# Patient Record
Sex: Female | Born: 1960 | Race: Asian | Hispanic: No | Marital: Married | Smoking: Never smoker
Health system: Southern US, Community
[De-identification: ages and names within clinical notes are randomized; demographics above are authoritative.]

## PROBLEM LIST (undated history)

## (undated) HISTORY — PX: OTHER SURGICAL HISTORY: SHX169

## (undated) HISTORY — PX: UTERINE FIBROID SURGERY: SHX826

---

## 2005-08-08 ENCOUNTER — Ambulatory Visit: Payer: Self-pay | Admitting: General Practice

## 2006-08-30 ENCOUNTER — Ambulatory Visit: Payer: Self-pay | Admitting: Obstetrics and Gynecology

## 2010-07-24 ENCOUNTER — Emergency Department: Payer: Self-pay | Admitting: Unknown Physician Specialty

## 2010-07-27 ENCOUNTER — Emergency Department: Payer: Self-pay | Admitting: Emergency Medicine

## 2011-04-12 ENCOUNTER — Ambulatory Visit: Payer: Self-pay | Admitting: Family Medicine

## 2011-06-07 ENCOUNTER — Ambulatory Visit: Payer: Self-pay | Admitting: Gastroenterology

## 2012-10-15 ENCOUNTER — Ambulatory Visit: Payer: Self-pay | Admitting: Family Medicine

## 2012-11-15 ENCOUNTER — Ambulatory Visit: Payer: Self-pay | Admitting: Orthopedic Surgery

## 2013-07-03 ENCOUNTER — Ambulatory Visit: Payer: Self-pay | Admitting: Internal Medicine

## 2013-10-16 ENCOUNTER — Ambulatory Visit: Payer: Self-pay | Admitting: Internal Medicine

## 2013-10-23 ENCOUNTER — Ambulatory Visit: Payer: Self-pay | Admitting: Internal Medicine

## 2014-11-17 ENCOUNTER — Other Ambulatory Visit: Payer: Self-pay | Admitting: Internal Medicine

## 2014-11-17 DIAGNOSIS — Z1239 Encounter for other screening for malignant neoplasm of breast: Secondary | ICD-10-CM

## 2014-11-26 ENCOUNTER — Ambulatory Visit: Payer: Self-pay | Attending: Internal Medicine

## 2014-12-10 ENCOUNTER — Ambulatory Visit
Admission: RE | Admit: 2014-12-10 | Discharge: 2014-12-10 | Disposition: A | Discharge status: Home / Self Care | Payer: Managed Care, Other (non HMO) | Source: Ambulatory Visit | Attending: Internal Medicine | Admitting: Internal Medicine

## 2014-12-10 DIAGNOSIS — Z1231 Encounter for screening mammogram for malignant neoplasm of breast: Secondary | ICD-10-CM | POA: Insufficient documentation

## 2014-12-10 DIAGNOSIS — Z1239 Encounter for other screening for malignant neoplasm of breast: Secondary | ICD-10-CM

## 2015-07-15 ENCOUNTER — Other Ambulatory Visit: Payer: Self-pay | Admitting: Internal Medicine

## 2015-07-15 DIAGNOSIS — Z1239 Encounter for other screening for malignant neoplasm of breast: Secondary | ICD-10-CM

## 2015-12-13 ENCOUNTER — Ambulatory Visit: Payer: Managed Care, Other (non HMO) | Attending: Internal Medicine

## 2016-07-17 DIAGNOSIS — Z780 Asymptomatic menopausal state: Secondary | ICD-10-CM | POA: Insufficient documentation

## 2016-07-17 DIAGNOSIS — Z8781 Personal history of (healed) traumatic fracture: Secondary | ICD-10-CM | POA: Insufficient documentation

## 2016-08-12 ENCOUNTER — Encounter: Payer: Self-pay | Admitting: Emergency Medicine

## 2016-08-12 ENCOUNTER — Emergency Department
Admission: EM | Admit: 2016-08-12 | Discharge: 2016-08-12 | Disposition: A | Discharge status: Home / Self Care | Payer: Managed Care, Other (non HMO) | Attending: Emergency Medicine | Admitting: Emergency Medicine

## 2016-08-12 DIAGNOSIS — H578 Other specified disorders of eye and adnexa: Secondary | ICD-10-CM | POA: Diagnosis present

## 2016-08-12 DIAGNOSIS — H1031 Unspecified acute conjunctivitis, right eye: Secondary | ICD-10-CM | POA: Insufficient documentation

## 2016-08-12 DIAGNOSIS — H1131 Conjunctival hemorrhage, right eye: Secondary | ICD-10-CM | POA: Insufficient documentation

## 2016-08-12 MED ORDER — POLYMYXIN B-TRIMETHOPRIM 10000-0.1 UNIT/ML-% OP SOLN: 2.0000 [drp] | Freq: Two times a day (BID) | OPHTHALMIC | 0 refills | Status: DC

## 2016-08-12 NOTE — ED Provider Notes (Signed)
[facility] Emergency Department Provider Note  ____________________________________________  Time seen: Approximately 6:53 PM  I have reviewed the triage vital signs and the nursing notes.   HISTORY  Chief Complaint Eye Problem    HPI Janice Robinson is a 56 y.o. female who presents emergency department complaining of right eye redness, irritation, drainage. Patient reports that a few days ago at work, she was sweating and all the somewhat with makeup ran into her right eye. Patient reports that since then she has had irritation, drainage,  Redness to the eye.no visual changes. Patient does not wear glasses or contacts. No involvement of the left eye. No fevers or chills, headaches, nasal congestion, sore throat, neck pain, chest pain, shortness of breath, abdominal pain, no vomiting. No medications prior to arrival.   History reviewed. No pertinent past medical history.  There are no active problems to display for this patient.   History reviewed. No pertinent surgical history.  Prior to Admission medications   Medication Sig Start Date End Date Taking? Authorizing Provider  trimethoprim-polymyxin b (POLYTRIM) ophthalmic solution Place 2 drops into the right eye 2 (two) times daily. 08/12/16   Doretta Remmert, Delorise Royals, PA-C    Allergies Patient has no known allergies.  No family history on file.  Social History Social History  Substance Use Topics  . Smoking status: Never Smoker  . Smokeless tobacco: Not on file  . Alcohol use Not on file     Review of Systems  Constitutional: No fever/chills Eyes: No visual changes. Positive for redness and drainage to the right eye. ENT: No upper respiratory complaints. Cardiovascular: no chest pain. Respiratory: no cough. No SOB. Gastrointestinal: No abdominal pain.  No nausea, no vomiting.  Musculoskeletal: Negative for musculoskeletal pain. Skin: Negative for rash, abrasions, lacerations,  ecchymosis. Neurological: Negative for headaches, focal weakness or numbness. 10-point ROS otherwise negative.  ____________________________________________   PHYSICAL EXAM:  VITAL SIGNS: ED Triage Vitals  Enc Vitals Group     BP 08/12/16 1819 110/65     Pulse Rate 08/12/16 1819 67     Resp 08/12/16 1819 18     Temp 08/12/16 1819 98 F (36.7 C)     Temp Source 08/12/16 1819 Oral     SpO2 08/12/16 1819 99 %     Weight 08/12/16 1820 120 lb (54.4 kg)     Height 08/12/16 1820 5\' 4"  (1.626 m)     Head Circumference --      Peak Flow --      Pain Score --      Pain Loc --      Pain Edu? --      Excl. in GC? --      Constitutional: Alert and oriented. Well appearing and in no acute distress. Eyes: Conconjunctiva on right is erythematous. There is subconjunctival hemorrhaging in the 6:00 position right eye. No drainage is identified at this time. Funduscopic exam reveals good red reflex, vasculature, optic disc with no acute abnormality.Marland Kitchen PERRL. EOMI. Head: Atraumatic. ENT:      Ears:       Nose: No congestion/rhinnorhea.      Mouth/Throat: Mucous membranes are moist.  Neck: No stridor.    Cardiovascular: Normal rate, regular rhythm. Normal S1 and S2.  Good peripheral circulation. Respiratory: Normal respiratory effort without tachypnea or retractions. Lungs CTAB. Good air entry to the bases with no decreased or absent breath sounds. Musculoskeletal: Full range of motion to all extremities. No gross deformities appreciated. Neurologic:  Normal speech and language. No gross focal neurologic deficits are appreciated.  Skin:  Skin is warm, dry and intact. No rash noted. Psychiatric: Mood and affect are normal. Speech and behavior are normal. Patient exhibits appropriate insight and judgement.   ____________________________________________   LABS (all labs ordered are listed, but only abnormal results are displayed)  Labs Reviewed - No data to  display ____________________________________________  EKG   ____________________________________________  RADIOLOGY   No results found.  ____________________________________________    PROCEDURES  Procedure(s) performed:    Procedures    Medications - No data to display   ____________________________________________   INITIAL IMPRESSION / ASSESSMENT AND PLAN / ED COURSE  Pertinent labs & imaging results that were available during my care of the patient were reviewed by me and considered in my medical decision making (see chart for details).  Review of the [place] CSRS was performed in accordance of the [facility] prior to dispensing any controlled drugs.     Patient's diagnosis is consistent with conjunctivitis and subconjunctival hemorrhage to the right eye. Exam was reassuring no indication for further workup.. Patient will be discharged home with prescriptions for antibiotic eyedrops. Patient is to follow up with primary care or ophthalmology as needed or otherwise directed. Patient is given ED precautions to return to the ED for any worsening or new symptoms.     ____________________________________________  FINAL CLINICAL IMPRESSION(S) / ED DIAGNOSES  Final diagnoses:  Acute bacterial conjunctivitis of right eye  Subconjunctival hemorrhage of right eye      NEW MEDICATIONS STARTED DURING THIS VISIT:  New Prescriptions   TRIMETHOPRIM-POLYMYXIN B (POLYTRIM) OPHTHALMIC SOLUTION    Place 2 drops into the right eye 2 (two) times daily.        This chart was dictated using voice recognition software/Dragon. Despite best efforts to proofread, errors can occur which can change the meaning. Any change was purely unintentional.    Racheal PatchesCuthriell, Yasamin Karel D, PA-C 08/12/16 1914    Phineas SemenGoodman, Graydon, MD 08/12/16 [phone]

## 2016-08-12 NOTE — ED Triage Notes (Signed)
R eye redness since yesterday.

## 2016-08-12 NOTE — ED Notes (Signed)

## 2017-01-08 ENCOUNTER — Emergency Department
Admission: EM | Admit: 2017-01-08 | Discharge: 2017-01-08 | Disposition: A | Discharge status: Home / Self Care | Payer: Managed Care, Other (non HMO) | Attending: Emergency Medicine | Admitting: Emergency Medicine

## 2017-01-08 ENCOUNTER — Emergency Department: Payer: Managed Care, Other (non HMO)

## 2017-01-08 ENCOUNTER — Encounter: Payer: Self-pay | Admitting: Intensive Care

## 2017-01-08 DIAGNOSIS — Y92410 Unspecified street and highway as the place of occurrence of the external cause: Secondary | ICD-10-CM | POA: Diagnosis not present

## 2017-01-08 DIAGNOSIS — S20211A Contusion of right front wall of thorax, initial encounter: Secondary | ICD-10-CM | POA: Insufficient documentation

## 2017-01-08 DIAGNOSIS — S161XXA Strain of muscle, fascia and tendon at neck level, initial encounter: Secondary | ICD-10-CM | POA: Diagnosis not present

## 2017-01-08 DIAGNOSIS — Y939 Activity, unspecified: Secondary | ICD-10-CM | POA: Diagnosis not present

## 2017-01-08 DIAGNOSIS — Y999 Unspecified external cause status: Secondary | ICD-10-CM | POA: Diagnosis not present

## 2017-01-08 DIAGNOSIS — S199XXA Unspecified injury of neck, initial encounter: Secondary | ICD-10-CM | POA: Diagnosis present

## 2017-01-08 MED ORDER — METHOCARBAMOL 750 MG PO TABS: 750.0000 mg | ORAL_TABLET | Freq: Four times a day (QID) | ORAL | 0 refills | Status: DC

## 2017-01-08 NOTE — Discharge Instructions (Signed)
Follow-up with your doctor. Not better in 5-7 days, use of Robaxin for muscle aches and pains, use over-the-counter ibuprofen or Tylenol to help with pain and inflammation, use ice to any areas that hurt, return if you're worsening

## 2017-01-08 NOTE — ED Notes (Signed)
See provider note for assessment
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
## 2017-01-15 ENCOUNTER — Other Ambulatory Visit: Payer: Self-pay | Admitting: Internal Medicine

## 2017-01-15 DIAGNOSIS — Z1239 Encounter for other screening for malignant neoplasm of breast: Secondary | ICD-10-CM

## 2017-04-26 ENCOUNTER — Ambulatory Visit
Admission: RE | Admit: 2017-04-26 | Discharge: 2017-04-26 | Disposition: A | Discharge status: Home / Self Care | Payer: Managed Care, Other (non HMO) | Source: Ambulatory Visit | Attending: Internal Medicine | Admitting: Internal Medicine

## 2017-04-26 DIAGNOSIS — Z1239 Encounter for other screening for malignant neoplasm of breast: Secondary | ICD-10-CM

## 2017-04-26 DIAGNOSIS — Z1231 Encounter for screening mammogram for malignant neoplasm of breast: Secondary | ICD-10-CM | POA: Insufficient documentation

## 2017-07-19 ENCOUNTER — Other Ambulatory Visit: Payer: Self-pay | Admitting: Internal Medicine

## 2017-07-19 DIAGNOSIS — R3129 Other microscopic hematuria: Secondary | ICD-10-CM

## 2017-08-14 NOTE — Progress Notes (Signed)
08/15/2017 9:36 AM   Crislyn T Quant May 10, 1960 [id number]  Referring provider: Leotis Shames, MD [address] [facility] [place], [place] [address]  Chief Complaint  Patient presents with  . Hematuria    HPI: Patient is a 57 -year-old [place] female who presents today as a referral from Dr. Leotis Shames for microscopic hematuria.    Patient was found to have microscopic hematuria on 07/17/2016 with 4-10 RBC's/hpf, on 11/06/2016 with 0-3 RBC's/hpf and on 07/17/2017 with 4-10 RBC's/hpf.  All with negative cultures.    She does not have a prior history of recurrent urinary tract infections, nephrolithiasis, trauma to the genitourinary tract or malignancies of the genitourinary tract.   She does not have a family medical history of nephrolithiasis, malignancies of the genitourinary tract or hematuria.   Today, she is not having symptoms of frequent urination, urgency, dysuria, nocturia, incontinence, hesitancy, intermittency, straining to urinate or a weak urinary stream.  Patient denies any gross hematuria, dysuria or suprapubic/flank pain.  Patient denies any fevers, chills, nausea or vomiting.  Her UA today demonstrates 3-10 RBC's.    She is not a smoker.  She is not exposed to second hand smoke.  She worked with Personnel officer, trichloroethylene, etc.   She has been in the [place] for the last 30 years.  She has not visited her home country since that time.    PMH: History reviewed. No pertinent past medical history.  Surgical History: Past Surgical History:  Procedure Laterality Date  . UTERINE FIBROID SURGERY      Home Medications:  Allergies as of 08/15/2017   No Known Allergies     Medication List        Accurate as of 08/15/17  9:36 AM. Always use your most recent med list.          ONE-A-DAY WOMENS PRENATAL 1 28-0.8-235 MG Caps Take by mouth.       Allergies: No Known Allergies  Family History: Family History  Problem Relation  Age of Onset  . Breast cancer Neg Hx   . Bladder Cancer Neg Hx   . Kidney cancer Neg Hx     Social History:  reports that she has never smoked. She does not have any smokeless tobacco history on file. She reports that she does not drink alcohol. Her drug history is not on file.  ROS: UROLOGY Frequent Urination?: No Hard to postpone urination?: No Burning/pain with urination?: No Get up at night to urinate?: No Leakage of urine?: No Urine stream starts and stops?: No Trouble starting stream?: No Do you have to strain to urinate?: No Blood in urine?: No Urinary tract infection?: No Sexually transmitted disease?: No Injury to kidneys or bladder?: No Painful intercourse?: No Weak stream?: No Currently pregnant?: No Vaginal bleeding?: No Last menstrual period?: n  Gastrointestinal Nausea?: No Vomiting?: No Indigestion/heartburn?: No Diarrhea?: No Constipation?: No  Constitutional Fever: No Night sweats?: No Weight loss?: No Fatigue?: No  Skin Skin rash/lesions?: No Itching?: No  Eyes Blurred vision?: No Double vision?: No  Ears/Nose/Throat Sore throat?: No Sinus problems?: No  Hematologic/Lymphatic Swollen glands?: No Easy bruising?: No  Cardiovascular Leg swelling?: No Chest pain?: No  Respiratory Cough?: No Shortness of breath?: No  Endocrine Excessive thirst?: No  Musculoskeletal Back pain?: No Joint pain?: No  Neurological Headaches?: No Dizziness?: No  Psychologic Depression?: No Anxiety?: No  Physical Exam: BP 120/67   Pulse (!) 59   Wt 117 lb 11.2 oz (53.4  kg)   BMI 22.99 kg/m   Constitutional:  Well nourished. Alert and oriented, No acute distress. HEENT: [place] AT, moist mucus membranes.  Trachea midline, no masses. Cardiovascular: No clubbing, cyanosis, or edema. Respiratory: Normal respiratory effort, no increased work of breathing. GI: Abdomen is soft, non tender, non distended, no abdominal masses. Liver and spleen not  palpable.  No hernias appreciated.  Stool sample for occult testing is not indicated.   GU: No CVA tenderness.  No bladder fullness or masses.   Skin: No rashes, bruises or suspicious lesions. Lymph: No cervical or inguinal adenopathy. Neurologic: Grossly intact, no focal deficits, moving all 4 extremities. Psychiatric: Normal mood and affect.  Laboratory Data: No results found for: WBC, HGB, HCT, MCV, PLT  No results found for: CREATININE  No results found for: PSA  No results found for: TESTOSTERONE  No results found for: HGBA1C  No results found for: TSH  No results found for: CHOL, HDL, CHOLHDL, VLDL, LDLCALC  No results found for: AST No results found for: ALT No components found for: ALKALINEPHOPHATASE No components found for: BILIRUBINTOTAL  No results found for: ESTRADIOL   Urinalysis No results found for: COLORURINE, APPEARANCEUR, LABSPEC, PHURINE, GLUCOSEU, HGBUR, BILIRUBINUR, KETONESUR, PROTEINUR, UROBILINOGEN, NITRITE, LEUKOCYTESUR  Pertinent Imaging: CT scan in 06/2013 noted prominent gonadal veins  Assessment & Plan:    1. Microscopic hematuria Explained to the patient that there are a number of causes that can be associated with blood in the urine, such as stones, UTI's, damage to the urinary tract and/or cancer. At this time, I felt that the patient warranted further urologic evaluation.   The AUA guidelines state that a CT urogram is the preferred imaging study to evaluate hematuria. I explained to the patient that a contrast material will be injected into a vein and that in rare instances, an allergic reaction can result and may even life threatening   The patient denies any allergies to contrast, iodine and/or seafood and is not taking metformin. Following the imaging study,  I've recommended a cystoscopy. I described how this is performed, typically in an office setting with a flexible cystoscope. We described the risks, benefits, and possible side  effects, the most common of which is a minor amount of blood in the urine and/or burning which usually resolves in 24 to 48 hours.   There is a 20% risk with gross hematuria and a 2% to 5% risk with microscopic hematuria of missing a bladder cancer.  Our goal is to identify the cancer in its early stage so as to give you the greater change of survival and a cure.    The patient had the opportunity to ask questions which were answered. Based upon this discussion, the patient is willing to proceed. Therefore, I've ordered: a CT Urogram and cystoscopy.   - The patient will return following all of the above for discussion of the results.   - UA + 3-10 RBC's.   - Urine culture pending  - BUN + creatinine  pending     Return for CT Urogram report and cystoscopy.  These notes generated with voice recognition software. I apologize for typographical errors.  Michiel CowboySHANNON Chaos Carlile, PA-C  [facility] [address]  [place], [place] [address] [phone]

## 2017-08-15 ENCOUNTER — Encounter: Payer: Self-pay | Admitting: Urology

## 2017-08-15 ENCOUNTER — Ambulatory Visit (INDEPENDENT_AMBULATORY_CARE_PROVIDER_SITE_OTHER): Payer: Managed Care, Other (non HMO) | Admitting: Urology

## 2017-08-15 VITALS — BP 120/67 | HR 59 | Wt 117.7 lb

## 2017-08-15 DIAGNOSIS — R3129 Other microscopic hematuria: Secondary | ICD-10-CM

## 2017-08-15 LAB — URINALYSIS, COMPLETE
Bilirubin, UA: NEGATIVE
Glucose, UA: NEGATIVE
Ketones, UA: NEGATIVE
Leukocytes, UA: NEGATIVE
Nitrite, UA: NEGATIVE
PROTEIN UA: NEGATIVE
Specific Gravity, UA: >1.03 — ABNORMAL HIGH (ref 1.005–1.030)
Urobilinogen, Ur: 0.2 mg/dL (ref 0.2–1.0)
pH, UA: 5 (ref 5.0–7.5)

## 2017-08-15 LAB — MICROSCOPIC EXAMINATION: WBC, UA: NONE SEEN /hpf (ref 0–5)

## 2017-08-16 LAB — BUN+CREAT
BUN/Creatinine Ratio: 18 (ref 9–23)
BUN: 11 mg/dL (ref 6–24)
CREATININE: 0.62 mg/dL (ref 0.57–1.00)
GFR, EST AFRICAN AMERICAN: 116 mL/min/{1.73_m2} (ref >59)
GFR, EST NON AFRICAN AMERICAN: 100 mL/min/{1.73_m2} (ref >59)

## 2017-08-18 LAB — CULTURE, URINE COMPREHENSIVE

## 2017-08-20 ENCOUNTER — Telehealth: Payer: Self-pay | Admitting: Urology

## 2017-08-20 NOTE — Telephone Encounter (Signed)
Dr. Thedore MinsSingh ordered a ct scan for this patient and had gotten it approved. I called her office and spoke with Selena BattenKim her nurse and asked if we could get this scheduled as a urology protocol since it was already approved. They agreed and so I called scheduling and they contacted the patient. We will be canceling the CT scan you ordered and use the one from their office. Patient was advised not to drink any oral contrast.  Marcelino DusterMichelle

## 2017-08-29 ENCOUNTER — Ambulatory Visit
Admission: RE | Admit: 2017-08-29 | Discharge: 2017-08-29 | Disposition: A | Discharge status: Home / Self Care | Payer: Managed Care, Other (non HMO) | Source: Ambulatory Visit | Attending: Internal Medicine | Admitting: Internal Medicine

## 2017-08-29 DIAGNOSIS — N9489 Other specified conditions associated with female genital organs and menstrual cycle: Secondary | ICD-10-CM | POA: Insufficient documentation

## 2017-08-29 DIAGNOSIS — R3129 Other microscopic hematuria: Secondary | ICD-10-CM | POA: Insufficient documentation

## 2017-08-29 DIAGNOSIS — I700 Atherosclerosis of aorta: Secondary | ICD-10-CM | POA: Insufficient documentation

## 2017-08-29 MED ORDER — IOPAMIDOL (ISOVUE-300) INJECTION 61%
100.0000 mL | Freq: Once | INTRAVENOUS | Status: AC | PRN
  Administered 2017-08-29: 100 mL via INTRAVENOUS

## 2017-09-20 ENCOUNTER — Ambulatory Visit (INDEPENDENT_AMBULATORY_CARE_PROVIDER_SITE_OTHER): Payer: Managed Care, Other (non HMO) | Admitting: Urology

## 2017-09-20 ENCOUNTER — Encounter: Payer: Self-pay | Admitting: Urology

## 2017-09-20 VITALS — BP 125/71 | HR 65 | Ht 60.0 in | Wt 117.0 lb

## 2017-09-20 DIAGNOSIS — R3129 Other microscopic hematuria: Secondary | ICD-10-CM | POA: Diagnosis not present

## 2017-09-20 LAB — URINALYSIS, COMPLETE
Bilirubin, UA: NEGATIVE
Glucose, UA: NEGATIVE
Ketones, UA: NEGATIVE
LEUKOCYTES UA: NEGATIVE
Nitrite, UA: NEGATIVE
Protein, UA: NEGATIVE
Specific Gravity, UA: <1.005 — ABNORMAL LOW (ref 1.005–1.030)
Urobilinogen, Ur: 0.2 mg/dL (ref 0.2–1.0)
pH, UA: 6.5 (ref 5.0–7.5)

## 2017-09-20 LAB — MICROSCOPIC EXAMINATION
EPITHELIAL CELLS (NON RENAL): NONE SEEN /HPF (ref 0–10)
WBC UA: NONE SEEN /HPF (ref 0–5)

## 2017-09-20 MED ORDER — LIDOCAINE HCL URETHRAL/MUCOSAL 2 % EX GEL
1.0000 | Freq: Once | CUTANEOUS | Status: AC
Start: 2017-09-20 — End: 2017-09-20
  Administered 2017-09-20: 1 via URETHRAL

## 2017-09-20 MED ORDER — CIPROFLOXACIN HCL 500 MG PO TABS
500.0000 mg | ORAL_TABLET | Freq: Once | ORAL | Status: AC
  Administered 2017-09-20: 500 mg via ORAL

## 2017-09-20 NOTE — Progress Notes (Signed)
   09/20/17  CC:  Chief Complaint  Patient presents with  . Cysto    HPI: Patient returns in evaluation of microscopic hematuria.  She has no dysuria or gross hematuria today.  I reviewed her CT scan which was done August 29, 2017 which was normal.  F/u  Blood pressure 125/71, pulse 65, height 5' (1.524 m), weight 53.1 kg. NED. A&Ox3.   No respiratory distress   Abd soft, NT, ND Normal external genitalia with patent urethral meatus  Cystoscopy Procedure Note  Patient identification was confirmed, informed consent was obtained, and patient was prepped using Betadine solution.  Lidocaine jelly was administered per urethral meatus.    Preoperative abx where received prior to procedure.    Procedure: - Flexible cystoscope introduced, without any difficulty.   - Thorough search of the bladder revealed:    normal urethral meatus    normal urothelium    no stones    no ulcers     no tumors    no urethral polyps    no trabeculation  - Ureteral orifices were normal in position and appearance.- clear efflux  -On exam-the meatus appeared normal, mild vaginal atrophy.  Introitus slightly enlarged, no lesions.  Bladder and urethra palpably normal.  Lyla SonCarrie was chaperone for cystoscopy and exam.  Post-Procedure: - Patient tolerated the procedure well  Assessment/ Plan:  Microscopic hematuria-discussed with patient and her daughter we will see her back in a year unless she develops any worrisome signs or symptoms such as dysuria, flank pain or gross hematuria she should return and/or notify us immediately.  Jerilee FieldMatthew Jermiyah Ricotta, MD

## 2018-04-24 ENCOUNTER — Other Ambulatory Visit: Payer: Self-pay | Admitting: Internal Medicine

## 2018-04-24 DIAGNOSIS — Z1231 Encounter for screening mammogram for malignant neoplasm of breast: Secondary | ICD-10-CM

## 2018-09-24 ENCOUNTER — Ambulatory Visit: Payer: Managed Care, Other (non HMO) | Admitting: Urology

## 2018-10-08 ENCOUNTER — Ambulatory Visit
Admission: RE | Admit: 2018-10-08 | Discharge: 2018-10-08 | Disposition: A | Discharge status: Home / Self Care | Payer: PRIVATE HEALTH INSURANCE | Source: Ambulatory Visit | Attending: Internal Medicine | Admitting: Internal Medicine

## 2018-10-08 DIAGNOSIS — Z1231 Encounter for screening mammogram for malignant neoplasm of breast: Secondary | ICD-10-CM | POA: Insufficient documentation

## 2018-10-14 ENCOUNTER — Ambulatory Visit: Payer: Managed Care, Other (non HMO) | Admitting: Urology

## 2018-10-14 ENCOUNTER — Encounter: Payer: Self-pay | Admitting: Urology

## 2018-10-24 ENCOUNTER — Encounter: Payer: Self-pay | Admitting: Urology

## 2018-10-24 ENCOUNTER — Other Ambulatory Visit: Payer: Self-pay

## 2018-10-24 ENCOUNTER — Ambulatory Visit (INDEPENDENT_AMBULATORY_CARE_PROVIDER_SITE_OTHER): Payer: No Typology Code available for payment source | Admitting: Urology

## 2018-10-24 VITALS — BP 113/65 | HR 60 | Ht <= 58 in | Wt 119.0 lb

## 2018-10-24 DIAGNOSIS — R3129 Other microscopic hematuria: Secondary | ICD-10-CM | POA: Diagnosis not present

## 2018-10-24 NOTE — Progress Notes (Signed)
   10/24/2018 8:59 AM   Janice Robinson Mar 21, 1960 [id number]  Reason for visit: Follow up microscopic hematuria  HPI: I saw Janice Robinson in urology clinic today for follow-up of microscopic hematuria.  She is a healthy 58 year old female that previously presented with 2 episodes of microscopic hematuria with 4-10 RBCs, and underwent a negative for microscopic hematuria work-up in August 2019 with Dr. Junious Silk with CT urogram and cystoscopy.  She is a non-smoker and denies any other carcinogenic exposures.  She denies any gross hematuria, dysuria, fevers, or flank pain since we saw her last.  Urinalysis today is benign with 0-5 WBCs, 0-2 RBCs, no epithelial cells, no bacteria, nitrite negative.    ROS: Please see flowsheet from today's date for complete review of systems.  Physical Exam: BP 113/65   Pulse 60   Ht 4\' 9"  (1.448 m)   Wt 119 lb (54 kg)   BMI 25.75 kg/m     Assessment & Plan:   In summary, the patient is a healthy 58 year old female with a negative microscopic hematuria work-up with CT urogram and cystoscopy in August 2019 with Dr. Junious Silk.  Urinalysis today is benign with 0 RBCs.  We discussed return precautions of gross hematuria, recurrent UTIs, or new urinary symptoms.  Follow-up as needed  A total of 15 minutes were spent face-to-face with the patient, greater than 50% was spent in patient education, counseling, and coordination of care regarding microscopic hematuria.   Billey Co, [facility] [address] [place],  [address] [phone]

## 2018-10-25 LAB — URINALYSIS, COMPLETE
Bilirubin, UA: NEGATIVE
Glucose, UA: NEGATIVE
Ketones, UA: NEGATIVE
Leukocytes,UA: NEGATIVE
Nitrite, UA: NEGATIVE
Protein,UA: NEGATIVE
Specific Gravity, UA: 1.025 (ref 1.005–1.030)
Urobilinogen, Ur: 0.2 mg/dL (ref 0.2–1.0)
pH, UA: 7 (ref 5.0–7.5)

## 2018-10-25 LAB — MICROSCOPIC EXAMINATION: Bacteria, UA: NONE SEEN

## 2019-05-12 ENCOUNTER — Ambulatory Visit: Payer: No Typology Code available for payment source | Attending: Internal Medicine

## 2019-05-12 ENCOUNTER — Other Ambulatory Visit: Payer: Self-pay

## 2019-05-12 DIAGNOSIS — Z23 Encounter for immunization: Secondary | ICD-10-CM

## 2019-05-12 NOTE — Progress Notes (Signed)
   Covid-19 Vaccination Clinic  Name:  Janice Robinson    MRN: [id number] DOB: 12-Nov-1960  05/12/2019  Janice Robinson was observed post Covid-19 immunization for 15 minutes without incident. She was provided with Vaccine Information Sheet and instruction to access the V-Safe system.   Janice Robinson was instructed to call 911 with any severe reactions post vaccine: Marland Kitchen Difficulty breathing  . Swelling of face and throat  . A fast heartbeat  . A bad rash all over body  . Dizziness and weakness   Immunizations Administered    Name Date Dose VIS Date Route   Pfizer COVID-19 Vaccine 05/12/2019  2:11 PM 0.3 mL 01/24/2019 Intramuscular   Manufacturer: [employer], [employer]   Lot: [id number]   NDC: [address]-3

## 2019-06-10 ENCOUNTER — Ambulatory Visit: Payer: No Typology Code available for payment source | Attending: Internal Medicine

## 2019-06-10 DIAGNOSIS — Z23 Encounter for immunization: Secondary | ICD-10-CM

## 2019-06-10 NOTE — Progress Notes (Signed)
   Covid-19 Vaccination Clinic  Name:  JALAYIA BAGHERI    MRN: [id number] DOB: [birth year]  06/10/2019  Ms. Halbur was observed post Covid-19 immunization for 15 minutes without incident. She was provided with Vaccine Information Sheet and instruction to access the V-Safe system.   Ms. Marlowe was instructed to call 911 with any severe reactions post vaccine: Marland Kitchen Difficulty breathing  . Swelling of face and throat  . A fast heartbeat  . A bad rash all over body  . Dizziness and weakness   Immunizations Administered    Name Date Dose VIS Date Route   Pfizer COVID-19 Vaccine 06/10/2019  8:50 AM 0.3 mL 04/09/2018 Intramuscular   Manufacturer: [employer], [employer]   Lot: [id number]   NDC: [address]-5

## 2020-07-11 IMAGING — MG DIGITAL SCREENING BILATERAL MAMMOGRAM WITH TOMO AND CAD
8 series · 9 of 24 positions shown · non-contrast
Comparison: Previous exam(s).

CLINICAL DATA: Screening.

EXAM:
DIGITAL SCREENING BILATERAL MAMMOGRAM WITH TOMO AND CAD

[L CC synth-2D]
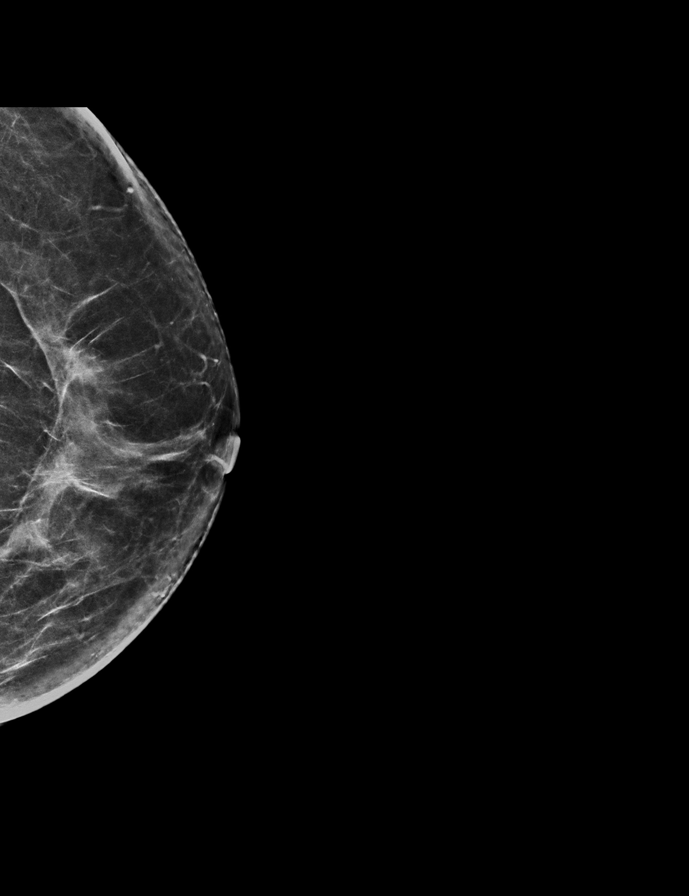

[L MLO synth-2D]
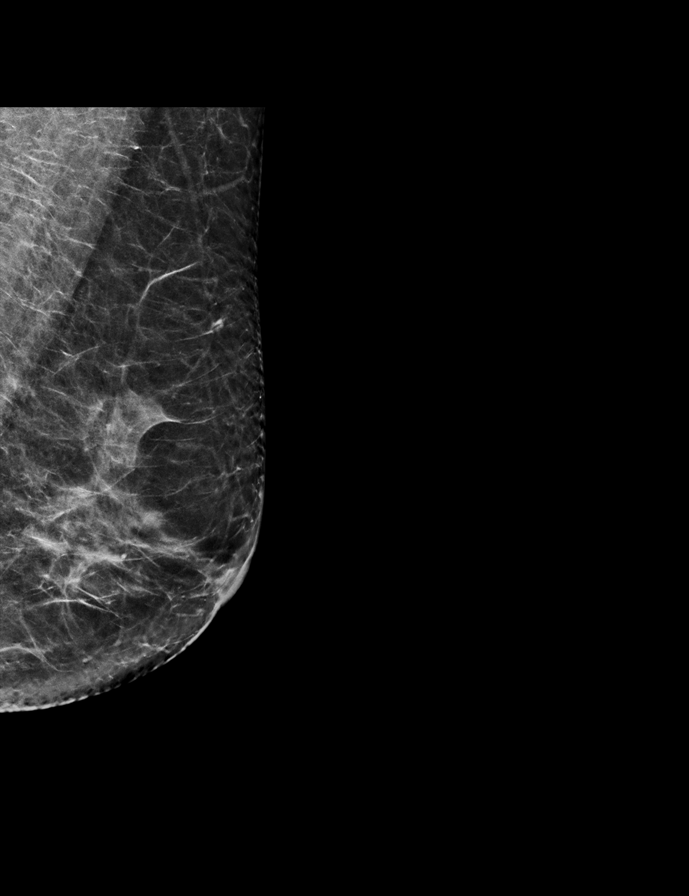

[R CC synth-2D]
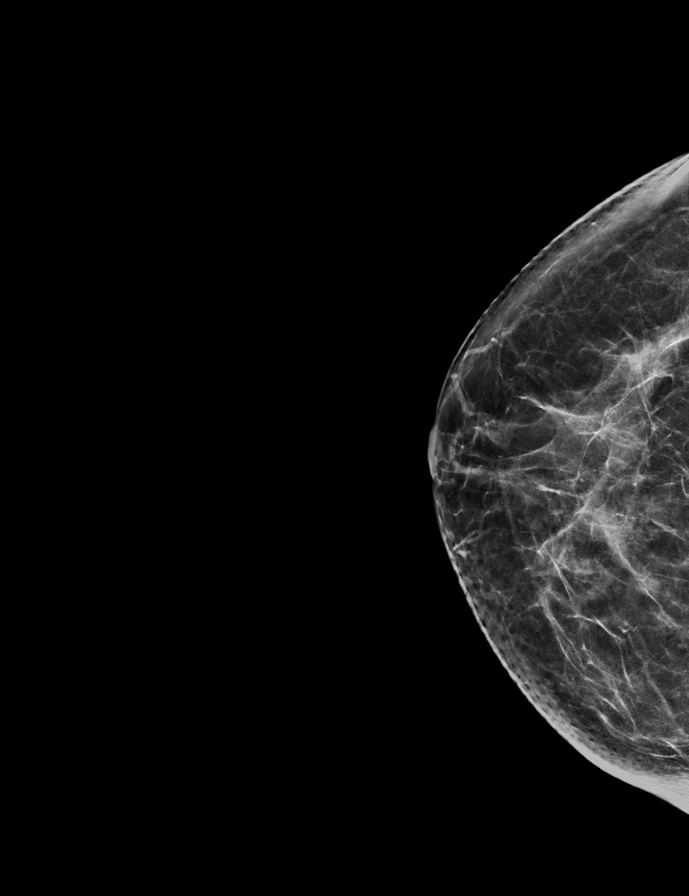

[R MLO synth-2D]
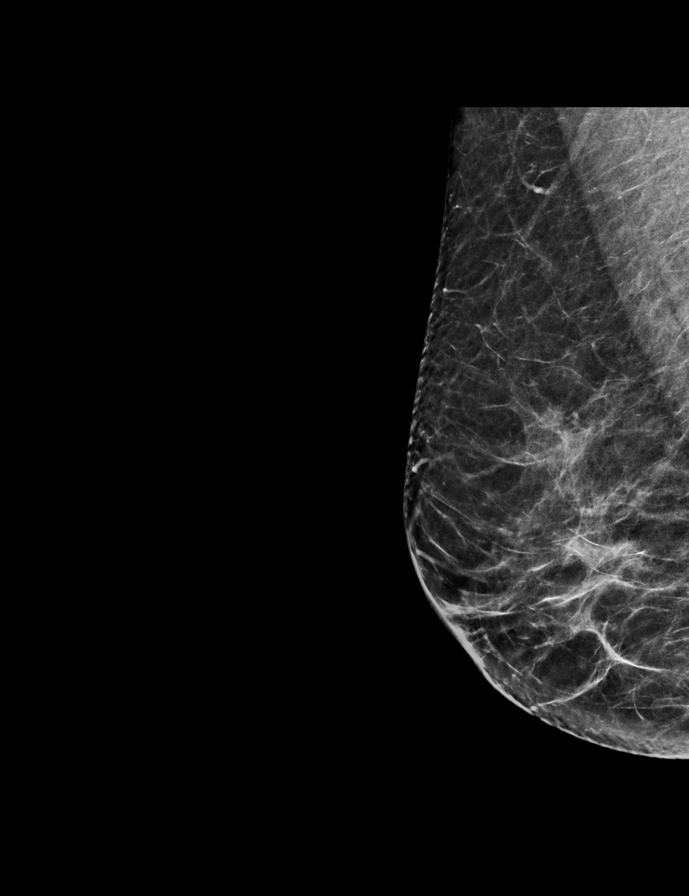

[R CC tomo · 2 of 64 frames shown]
[frame 21/64]
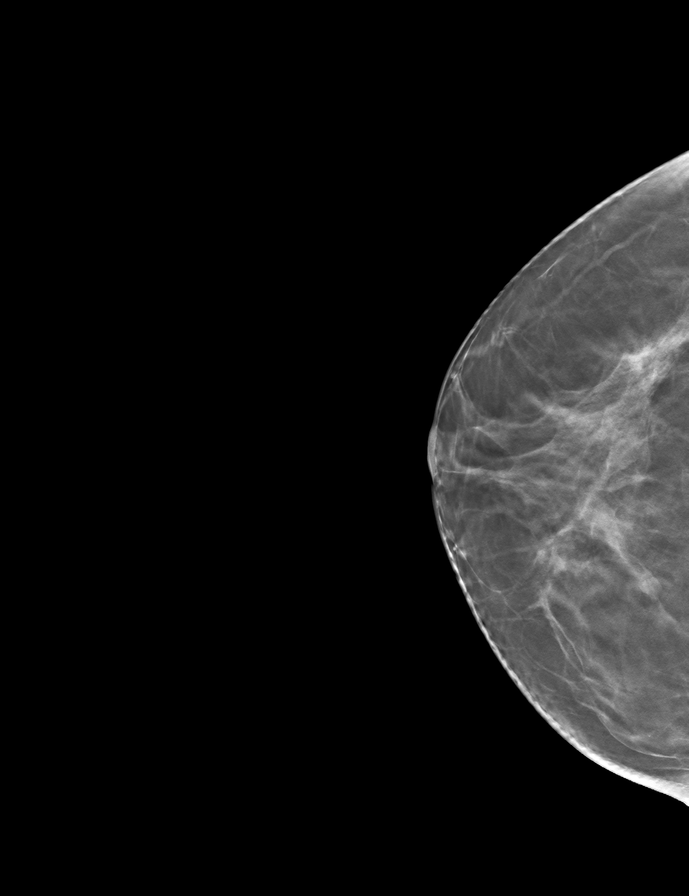
[frame 33/64]
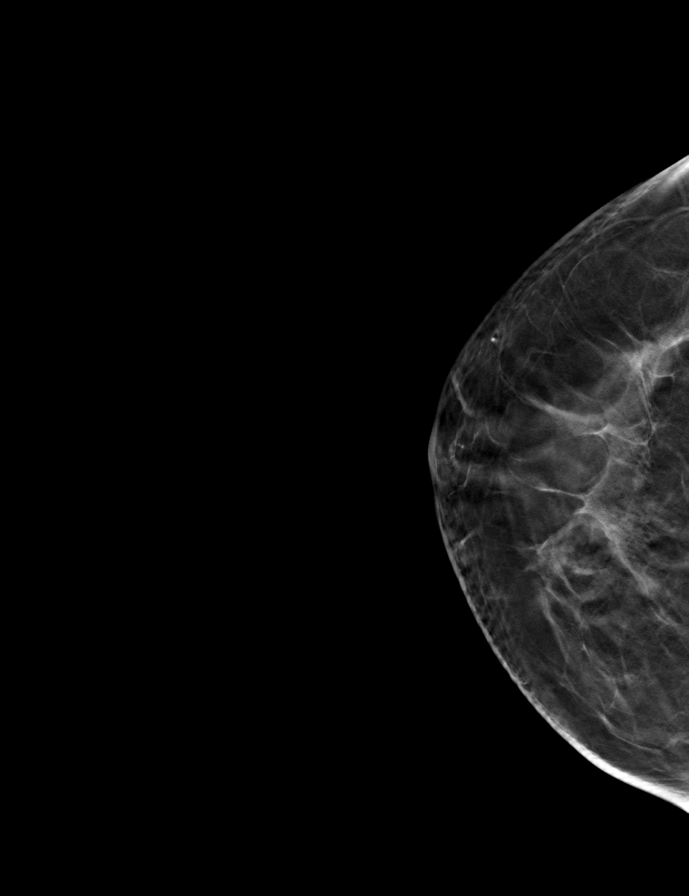

[L CC tomo · tomo slice 35/68.0]
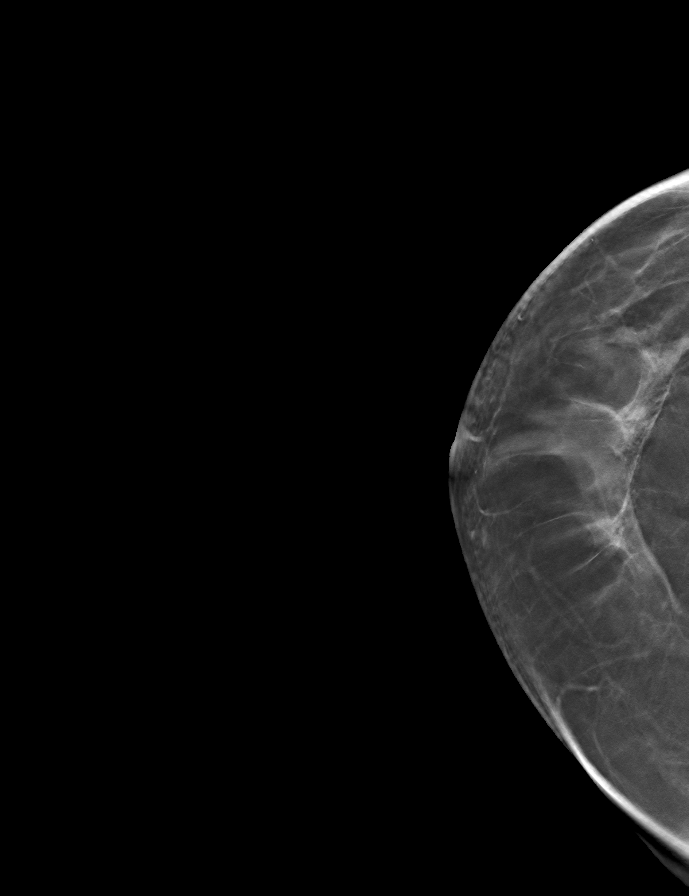

[R MLO tomo · tomo slice 31/62.0]
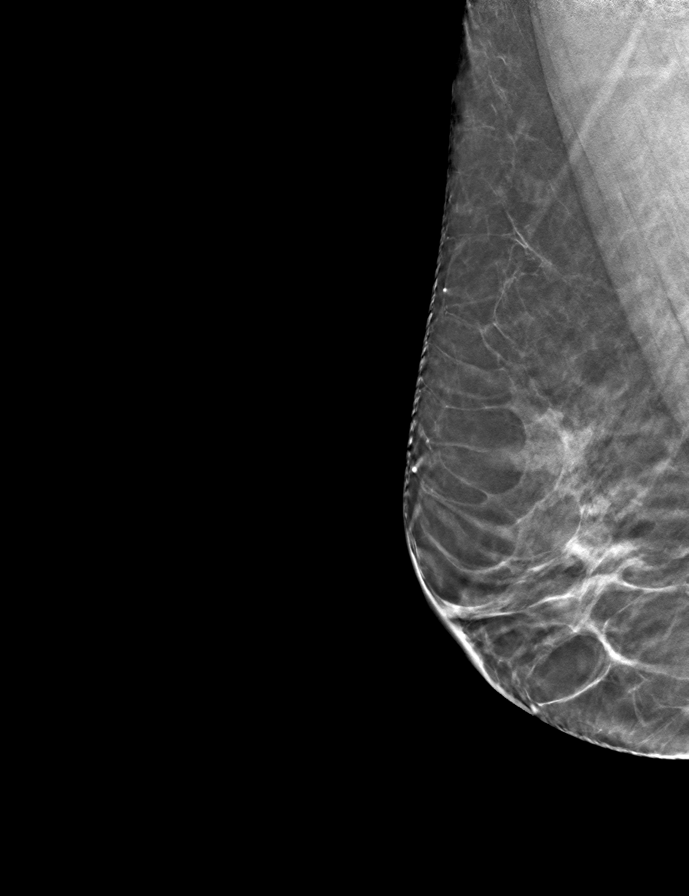

[L MLO tomo · tomo slice 33/65.0]
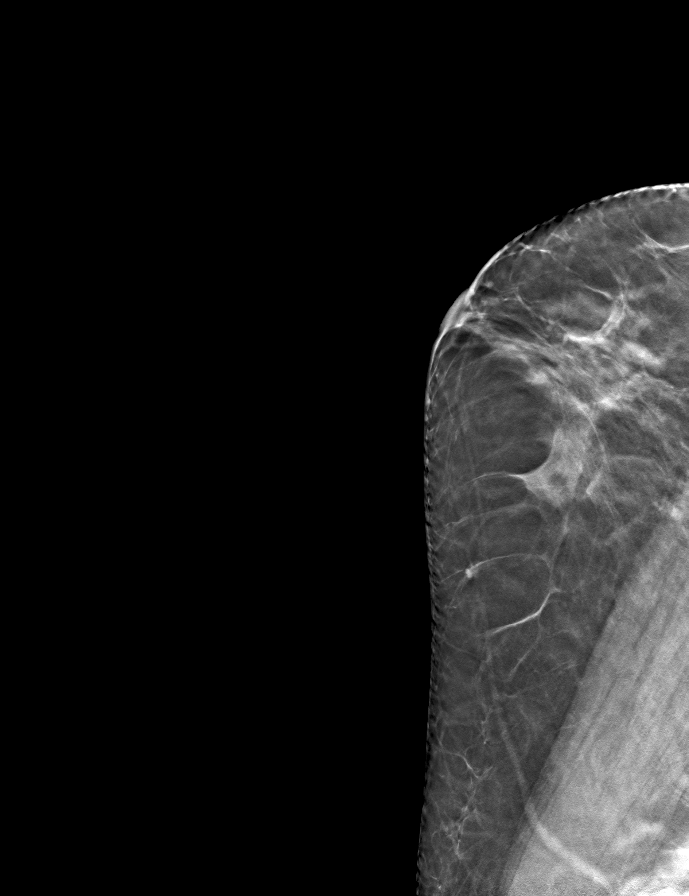

[9 of 24 positions shown; findings below may reference images not displayed]

ACR Breast Density Category c: The breast tissue is heterogeneously
dense, which may obscure small masses.
FINDINGS: There are no findings suspicious for malignancy. Images were
processed with CAD.
IMPRESSION: No mammographic evidence of malignancy. A result letter of this
screening mammogram will be mailed directly to the patient.

RECOMMENDATION:
Screening mammogram in one year. (Code:[id number])

BI-RADS CATEGORY  1: Negative.

## 2020-08-05 ENCOUNTER — Other Ambulatory Visit: Payer: Self-pay | Admitting: Family Medicine

## 2020-08-05 DIAGNOSIS — Z1231 Encounter for screening mammogram for malignant neoplasm of breast: Secondary | ICD-10-CM

## 2020-08-18 ENCOUNTER — Ambulatory Visit
Admission: RE | Admit: 2020-08-18 | Discharge: 2020-08-18 | Disposition: A | Discharge status: Home / Self Care | Payer: BC Managed Care – PPO | Source: Ambulatory Visit | Attending: Family Medicine | Admitting: Family Medicine

## 2020-08-18 ENCOUNTER — Other Ambulatory Visit: Payer: Self-pay

## 2020-08-18 DIAGNOSIS — Z1231 Encounter for screening mammogram for malignant neoplasm of breast: Secondary | ICD-10-CM | POA: Diagnosis not present

## 2022-07-03 ENCOUNTER — Encounter: Payer: Self-pay | Admitting: Emergency Medicine

## 2022-07-04 ENCOUNTER — Encounter
Admission: RE | Disposition: A | Discharge status: Home / Self Care | Payer: Self-pay | Source: Home / Self Care | Attending: Gastroenterology

## 2022-07-04 ENCOUNTER — Encounter: Payer: Self-pay | Admitting: *Deleted

## 2022-07-04 ENCOUNTER — Ambulatory Visit: Payer: 59 | Admitting: Anesthesiology

## 2022-07-04 ENCOUNTER — Ambulatory Visit
Admission: RE | Admit: 2022-07-04 | Discharge: 2022-07-04 | Disposition: A | Discharge status: Home / Self Care | Payer: 59 | Attending: Gastroenterology | Admitting: Gastroenterology

## 2022-07-04 DIAGNOSIS — K5730 Diverticulosis of large intestine without perforation or abscess without bleeding: Secondary | ICD-10-CM | POA: Insufficient documentation

## 2022-07-04 DIAGNOSIS — K640 First degree hemorrhoids: Secondary | ICD-10-CM | POA: Insufficient documentation

## 2022-07-04 DIAGNOSIS — D125 Benign neoplasm of sigmoid colon: Secondary | ICD-10-CM | POA: Diagnosis not present

## 2022-07-04 DIAGNOSIS — Z9851 Tubal ligation status: Secondary | ICD-10-CM | POA: Insufficient documentation

## 2022-07-04 DIAGNOSIS — Z1211 Encounter for screening for malignant neoplasm of colon: Secondary | ICD-10-CM | POA: Insufficient documentation

## 2022-07-04 DIAGNOSIS — D126 Benign neoplasm of colon, unspecified: Secondary | ICD-10-CM | POA: Diagnosis not present

## 2022-07-04 DIAGNOSIS — K635 Polyp of colon: Secondary | ICD-10-CM | POA: Diagnosis not present

## 2022-07-04 DIAGNOSIS — K649 Unspecified hemorrhoids: Secondary | ICD-10-CM | POA: Diagnosis not present

## 2022-07-04 HISTORY — PX: COLONOSCOPY WITH PROPOFOL: SHX5780

## 2022-07-04 SURGERY — COLONOSCOPY WITH PROPOFOL
Anesthesia: General

## 2022-07-04 MED ORDER — SODIUM CHLORIDE 0.9 % IV SOLN
INTRAVENOUS | Status: DC
  Administered 2022-07-04: 10000 mL via INTRAVENOUS

## 2022-07-04 MED ORDER — PROPOFOL 10 MG/ML IV BOLUS
INTRAVENOUS | Status: DC | PRN
  Administered 2022-07-04: 180 ug/kg/min via INTRAVENOUS
  Administered 2022-07-04: 90 mg via INTRAVENOUS

## 2022-07-04 MED ORDER — PROPOFOL 10 MG/ML IV BOLUS
INTRAVENOUS | Status: AC
  Filled 2022-07-04: qty 20

## 2022-07-04 MED ORDER — LIDOCAINE HCL (CARDIAC) PF 100 MG/5ML IV SOSY
PREFILLED_SYRINGE | INTRAVENOUS | Status: DC | PRN
  Administered 2022-07-04: 40 mg via INTRAVENOUS

## 2022-07-04 MED ORDER — PHENYLEPHRINE HCL (PRESSORS) 10 MG/ML IV SOLN
INTRAVENOUS | Status: DC | PRN
  Administered 2022-07-04: 80 ug via INTRAVENOUS

## 2022-07-04 NOTE — Anesthesia Preprocedure Evaluation (Signed)
Anesthesia Evaluation  Patient identified by MRN, date of birth, ID band Patient awake    Reviewed: Allergy & Precautions, NPO status , Patient's Chart, lab work & pertinent test results  Airway Mallampati: III  TM Distance: >3 FB Neck ROM: full    Dental  (+) Chipped, Poor Dentition   Pulmonary neg pulmonary ROS, neg shortness of breath   Pulmonary exam normal        Cardiovascular Exercise Tolerance: Good (-) angina negative cardio ROS Normal cardiovascular exam     Neuro/Psych negative neurological ROS  negative psych ROS   GI/Hepatic negative GI ROS, Neg liver ROS,neg GERD  ,,  Endo/Other  negative endocrine ROS    Renal/GU negative Renal ROS  negative genitourinary   Musculoskeletal   Abdominal   Peds  Hematology negative hematology ROS (+)   Anesthesia Other Findings History reviewed. No pertinent past medical history.  Past Surgical History: No date: Carpal Tunnell release; Bilateral No date: UTERINE FIBROID SURGERY  BMI    Body Mass Index: 23.76 kg/m      Reproductive/Obstetrics negative OB ROS                             Anesthesia Physical Anesthesia Plan  ASA: 1  Anesthesia Plan: General   Post-op Pain Management:    Induction: Intravenous  PONV Risk Score and Plan: Propofol infusion and TIVA  Airway Management Planned: Natural Airway and Nasal Cannula  Additional Equipment:   Intra-op Plan:   Post-operative Plan:   Informed Consent: I have reviewed the patients History and Physical, chart, labs and discussed the procedure including the risks, benefits and alternatives for the proposed anesthesia with the patient or authorized representative who has indicated his/her understanding and acceptance.     Dental Advisory Given  Plan Discussed with: Anesthesiologist, CRNA and Surgeon  Anesthesia Plan Comments: (Patient consented for risks of anesthesia  including but not limited to:  - adverse reactions to medications - risk of airway placement if required - damage to eyes, teeth, lips or other oral mucosa - nerve damage due to positioning  - sore throat or hoarseness - Damage to heart, brain, nerves, lungs, other parts of body or loss of life  Patient voiced understanding.)       Anesthesia Quick Evaluation

## 2022-07-04 NOTE — Transfer of Care (Signed)
Immediate Anesthesia Transfer of Care Note  Patient: Francisco T Minix  Procedure(s) Performed: COLONOSCOPY WITH PROPOFOL  Patient Location: PACU  Anesthesia Type:General  Level of Consciousness: drowsy  Airway & Oxygen Therapy: Patient Spontanous Breathing  Post-op Assessment: Report given to RN and Post -op Vital signs reviewed and stable  Post vital signs: Reviewed and stable  Last Vitals:  Vitals Value Taken Time  BP 117/77 07/04/22 1343  Temp 35.8 C 07/04/22 1342  Pulse 75 07/04/22 1345  Resp 18 07/04/22 1345  SpO2 100 % 07/04/22 1345  Vitals shown include unvalidated device data.  Last Pain:  Vitals:   07/04/22 1342  TempSrc: Temporal  PainSc: Asleep         Complications: No notable events documented.

## 2022-07-04 NOTE — H&P (Signed)
Outpatient short stay form Pre-procedure 07/04/2022  Regis Bill, MD  Primary Physician: Marisue Ivan, MD  Reason for visit:  Screening  History of present illness:    62 y/o lady with no significant PMH here for screening colonoscopy. Had normal colonoscopy 10 years ago. History of tubal ligation. No family history of GI malignancies. No blood thinners.    Current Facility-Administered Medications:    0.9 %  sodium chloride infusion, , Intravenous, Continuous, Yacqub Baston, Rossie Muskrat, MD, Stopped at 07/04/22 1337  Facility-Administered Medications Ordered in Other Encounters:    lidocaine (cardiac) 100 mg/36mL (XYLOCAINE) injection 2%, , Intravenous, Anesthesia Intra-op, Pidana, Munesh, CRNA, 40 mg at 07/04/22 1325   phenylephrine (NEO-SYNEPHRINE) injection, , Intravenous, Anesthesia Intra-op, Pidana, Munesh, CRNA, 80 mcg at 07/04/22 1328   propofol (DIPRIVAN) 10 mg/mL bolus/IV push, , Intravenous, Anesthesia Intra-op, Pidana, Munesh, CRNA, 180 mcg/kg/min at 07/04/22 1326  Medications Prior to Admission  Medication Sig Dispense Refill Last Dose   meloxicam (MOBIC) 7.5 MG tablet Take 7.5 mg by mouth daily.   07/03/2022   Prenat-Fe Carbonyl-FA-Omega 3 (ONE-A-DAY WOMENS PRENATAL 1) 28-0.8-235 MG CAPS Take by mouth.   Past Week     No Known Allergies   History reviewed. No pertinent past medical history.  Review of systems:  Otherwise negative.    Physical Exam  Gen: Alert, oriented. Appears stated age.  HEENT: PERRLA. Lungs: No respiratory distress CV: RRR Abd: soft, benign, no masses Ext: No edema    Planned procedures: Proceed with colonoscopy. The patient understands the nature of the planned procedure, indications, risks, alternatives and potential complications including but not limited to bleeding, infection, perforation, damage to internal organs and possible oversedation/side effects from anesthesia. The patient agrees and gives consent to proceed.  Please  refer to procedure notes for findings, recommendations and patient disposition/instructions.     Regis Bill, MD [facility]

## 2022-07-04 NOTE — Anesthesia Postprocedure Evaluation (Signed)
Anesthesia Post Note  Patient: Janice Robinson  Procedure(s) Performed: COLONOSCOPY WITH PROPOFOL  Patient location during evaluation: Endoscopy Anesthesia Type: General Level of consciousness: awake and alert Pain management: pain level controlled Vital Signs Assessment: post-procedure vital signs reviewed and stable Respiratory status: spontaneous breathing, nonlabored ventilation, respiratory function stable and patient connected to nasal cannula oxygen Cardiovascular status: blood pressure returned to baseline and stable Postop Assessment: no apparent nausea or vomiting Anesthetic complications: no   No notable events documented.   Last Vitals:  Vitals:   07/04/22 1352 07/04/22 1402  BP: (!) 99/54 135/89  Pulse:    Resp:    Temp:    SpO2:      Last Pain:  Vitals:   07/04/22 1352  TempSrc:   PainSc: 0-No pain                 Janice Robinson

## 2022-07-04 NOTE — Op Note (Signed)
[facility] Patient Name: Janice Robinson Procedure Date: 07/04/2022 1:09 PM MRN: [id number] Account #: [id number] Date of Birth: February 03, 1961 Admit Type: Outpatient Age: 62 Room: [facility] ENDO ROOM 1 Gender: Female Note Status: Finalized Instrument Name: Peds Colonoscope [id number] Procedure:             Colonoscopy Indications:           Screening for colorectal malignant neoplasm Providers:             Eather Colas MD, MD Referring MD:          Marisue Ivan (Referring MD) Medicines:             Monitored Anesthesia Care Complications:         No immediate complications. Estimated blood loss:                         Minimal. Procedure:             Pre-Anesthesia Assessment:                        - Prior to the procedure, a History and Physical was                         performed, and patient medications and allergies were                         reviewed. The patient is competent. The risks and                         benefits of the procedure and the sedation options and                         risks were discussed with the patient. All questions                         were answered and informed consent was obtained.                         Patient identification and proposed procedure were                         verified by the physician, the nurse, the                         anesthesiologist, the anesthetist and the technician                         in the endoscopy suite. Mental Status Examination:                         alert and oriented. Airway Examination: normal                         oropharyngeal airway and neck mobility. Respiratory                         Examination: clear to auscultation. CV Examination:  normal. Prophylactic Antibiotics: The patient does not                         require prophylactic antibiotics. Prior                         Anticoagulants: The patient has taken no anticoagulant                          or antiplatelet agents. ASA Grade Assessment: I - A                         normal, healthy patient. After reviewing the risks and                         benefits, the patient was deemed in satisfactory                         condition to undergo the procedure. The anesthesia                         plan was to use monitored anesthesia care (MAC).                         Immediately prior to administration of medications,                         the patient was re-assessed for adequacy to receive                         sedatives. The heart rate, respiratory rate, oxygen                         saturations, blood pressure, adequacy of pulmonary                         ventilation, and response to care were monitored                         throughout the procedure. The physical status of the                         patient was re-assessed after the procedure.                        After obtaining informed consent, the colonoscope was                         passed under direct vision. Throughout the procedure,                         the patient's blood pressure, pulse, and oxygen                         saturations were monitored continuously. The                         Colonoscope was introduced through the anus and  advanced to the the terminal ileum. The colonoscopy                         was performed without difficulty. The patient                         tolerated the procedure well. The quality of the bowel                         preparation was good. The terminal ileum, ileocecal                         valve, appendiceal orifice, and rectum were                         photographed. Findings:      The perianal and digital rectal examinations were normal.      The terminal ileum appeared normal.      A single small-mouthed diverticulum was found in the ascending colon.      Two sessile polyps were found in the sigmoid colon.  The polyps were 3 to       4 mm in size. These polyps were removed with a cold snare. Resection and       retrieval were complete. Estimated blood loss was minimal.      Internal hemorrhoids were found during retroflexion. The hemorrhoids       were Grade I (internal hemorrhoids that do not prolapse).      The exam was otherwise without abnormality on direct and retroflexion       views. Impression:            - The examined portion of the ileum was normal.                        - Diverticulosis in the ascending colon.                        - Two 3 to 4 mm polyps in the sigmoid colon, removed                         with a cold snare. Resected and retrieved.                        - Internal hemorrhoids.                        - The examination was otherwise normal on direct and                         retroflexion views. Recommendation:        - Discharge patient to home.                        - Resume previous diet.                        - Continue present medications.                        - Await pathology results.                        -  Repeat colonoscopy date to be determined after                         pending pathology results are reviewed for                         surveillance.                        - Return to referring physician as previously                         scheduled. Procedure Code(s):     --- Professional ---                        [phone], Colonoscopy, flexible; with removal of                         tumor(s), polyp(s), or other lesion(s) by snare                         technique Diagnosis Code(s):     --- Professional ---                        Z12.11, Encounter for screening for malignant neoplasm                         of colon                        K64.0, First degree hemorrhoids                        D12.5, Benign neoplasm of sigmoid colon                        K57.30, Diverticulosis of large intestine without                          perforation or abscess without bleeding CPT copyright 2022 [employer]. All rights reserved. The codes documented in this report are preliminary and upon coder review may  be revised to meet current compliance requirements. Eather Colas MD, MD 07/04/2022 1:44:08 PM Number of Addenda: 0 Note Initiated On: 07/04/2022 1:09 PM Scope Withdrawal Time: 0 hours 8 minutes 12 seconds  Total Procedure Duration: 0 hours 11 minutes 16 seconds  Estimated Blood Loss:  Estimated blood loss: none. Estimated blood loss was                         minimal.      [facility]

## 2022-07-04 NOTE — Interval H&P Note (Signed)
History and Physical Interval Note:  07/04/2022 1:41 PM  Janice Robinson  has presented today for surgery, with the diagnosis of colon cancer screening.  The various methods of treatment have been discussed with the patient and family. After consideration of risks, benefits and other options for treatment, the patient has consented to  Procedure(s): COLONOSCOPY WITH PROPOFOL (N/A) as a surgical intervention.  The patient's history has been reviewed, patient examined, no change in status, stable for surgery.  I have reviewed the patient's chart and labs.  Questions were answered to the patient's satisfaction.     Regis Bill  Ok to proceed with colonoscopy

## 2022-07-05 ENCOUNTER — Encounter: Payer: Self-pay | Admitting: Gastroenterology

## 2022-07-07 LAB — SURGICAL PATHOLOGY

## 2022-07-24 DIAGNOSIS — I700 Atherosclerosis of aorta: Secondary | ICD-10-CM | POA: Diagnosis not present

## 2022-07-24 DIAGNOSIS — Z131 Encounter for screening for diabetes mellitus: Secondary | ICD-10-CM | POA: Diagnosis not present

## 2022-07-24 DIAGNOSIS — Z13220 Encounter for screening for lipoid disorders: Secondary | ICD-10-CM | POA: Diagnosis not present

## 2022-07-24 DIAGNOSIS — Z136 Encounter for screening for cardiovascular disorders: Secondary | ICD-10-CM | POA: Diagnosis not present

## 2022-07-24 DIAGNOSIS — Z0000 Encounter for general adult medical examination without abnormal findings: Secondary | ICD-10-CM | POA: Diagnosis not present

## 2022-07-31 ENCOUNTER — Other Ambulatory Visit: Payer: Self-pay | Admitting: Family Medicine

## 2022-07-31 DIAGNOSIS — Z1231 Encounter for screening mammogram for malignant neoplasm of breast: Secondary | ICD-10-CM

## 2022-07-31 DIAGNOSIS — L239 Allergic contact dermatitis, unspecified cause: Secondary | ICD-10-CM | POA: Diagnosis not present

## 2022-07-31 DIAGNOSIS — I700 Atherosclerosis of aorta: Secondary | ICD-10-CM | POA: Diagnosis not present

## 2022-07-31 DIAGNOSIS — Z0000 Encounter for general adult medical examination without abnormal findings: Secondary | ICD-10-CM | POA: Diagnosis not present

## 2022-08-09 ENCOUNTER — Ambulatory Visit
Admission: RE | Admit: 2022-08-09 | Discharge: 2022-08-09 | Disposition: A | Discharge status: Home / Self Care | Payer: 59 | Source: Ambulatory Visit | Attending: Family Medicine | Admitting: Family Medicine

## 2022-08-09 DIAGNOSIS — Z1231 Encounter for screening mammogram for malignant neoplasm of breast: Secondary | ICD-10-CM | POA: Diagnosis not present

## 2022-09-28 DIAGNOSIS — R21 Rash and other nonspecific skin eruption: Secondary | ICD-10-CM | POA: Diagnosis not present

## 2023-01-04 DIAGNOSIS — L308 Other specified dermatitis: Secondary | ICD-10-CM | POA: Diagnosis not present

## 2023-08-16 ENCOUNTER — Other Ambulatory Visit: Payer: Self-pay | Admitting: Family Medicine

## 2023-08-16 DIAGNOSIS — Z1231 Encounter for screening mammogram for malignant neoplasm of breast: Secondary | ICD-10-CM

## 2023-08-27 ENCOUNTER — Ambulatory Visit
Admission: RE | Admit: 2023-08-27 | Discharge: 2023-08-27 | Disposition: A | Discharge status: Home / Self Care | Source: Ambulatory Visit | Attending: Family Medicine | Admitting: Family Medicine

## 2023-08-27 DIAGNOSIS — Z1231 Encounter for screening mammogram for malignant neoplasm of breast: Secondary | ICD-10-CM | POA: Diagnosis present
